# Patient Record
Sex: Male | Born: 1955 | Race: White | Hispanic: No | Marital: Single | Smoking: Current every day smoker
Health system: Southern US, Community
[De-identification: ages and names within clinical notes are randomized; demographics above are authoritative.]

## PROBLEM LIST (undated history)

## (undated) DIAGNOSIS — B1920 Unspecified viral hepatitis C without hepatic coma: Secondary | ICD-10-CM

## (undated) DIAGNOSIS — I509 Heart failure, unspecified: Secondary | ICD-10-CM

## (undated) DIAGNOSIS — M419 Scoliosis, unspecified: Secondary | ICD-10-CM

## (undated) DIAGNOSIS — J449 Chronic obstructive pulmonary disease, unspecified: Secondary | ICD-10-CM

## (undated) HISTORY — DX: Unspecified viral hepatitis C without hepatic coma: B19.20

## (undated) HISTORY — PX: TONSILLECTOMY: SUR1361

---

## 2017-06-15 ENCOUNTER — Encounter (HOSPITAL_COMMUNITY): Payer: Self-pay | Admitting: Emergency Medicine

## 2017-06-15 ENCOUNTER — Other Ambulatory Visit: Payer: Self-pay

## 2017-06-15 ENCOUNTER — Emergency Department (HOSPITAL_COMMUNITY)
Admission: EM | Admit: 2017-06-15 | Discharge: 2017-06-15 | Disposition: A | Discharge status: Home / Self Care | Payer: Medicaid Other | Attending: Emergency Medicine | Admitting: Emergency Medicine

## 2017-06-15 ENCOUNTER — Emergency Department (HOSPITAL_COMMUNITY): Payer: Medicaid Other

## 2017-06-15 DIAGNOSIS — I509 Heart failure, unspecified: Secondary | ICD-10-CM | POA: Diagnosis not present

## 2017-06-15 DIAGNOSIS — J40 Bronchitis, not specified as acute or chronic: Secondary | ICD-10-CM | POA: Insufficient documentation

## 2017-06-15 DIAGNOSIS — R05 Cough: Secondary | ICD-10-CM | POA: Diagnosis present

## 2017-06-15 DIAGNOSIS — F17210 Nicotine dependence, cigarettes, uncomplicated: Secondary | ICD-10-CM | POA: Diagnosis not present

## 2017-06-15 DIAGNOSIS — J449 Chronic obstructive pulmonary disease, unspecified: Secondary | ICD-10-CM | POA: Insufficient documentation

## 2017-06-15 HISTORY — DX: Chronic obstructive pulmonary disease, unspecified: J44.9

## 2017-06-15 HISTORY — DX: Heart failure, unspecified: I50.9

## 2017-06-15 HISTORY — DX: Scoliosis, unspecified: M41.9

## 2017-06-15 MED ORDER — BENZONATATE 100 MG PO CAPS: 100.0000 mg | ORAL_CAPSULE | Freq: Three times a day (TID) | ORAL | 0 refills | Status: DC

## 2017-06-15 MED ORDER — DOXYCYCLINE HYCLATE 100 MG PO CAPS: 100.0000 mg | ORAL_CAPSULE | Freq: Two times a day (BID) | ORAL | 0 refills | Status: DC

## 2017-06-15 MED ORDER — GUAIFENESIN-CODEINE 100-10 MG/5ML PO SOLN: 5.0000 mL | Freq: Every evening | ORAL | 0 refills | Status: DC | PRN

## 2017-06-15 NOTE — ED Provider Notes (Signed)
[facility] EMERGENCY DEPARTMENT Provider Note   CSN: [id number] Arrival date & time: 06/15/17  1548     History   Chief Complaint Chief Complaint  Patient presents with  . Cough    HPI Blanchard KelchCarl F Konopka is a 62 y.o. male who presents to the ED with a URI symptoms that started a week ago. Patient reports cough, congestion and nasal drainage. Patient reports that when he was a child that he was hospitalized for pneumonia and now has been dx with COPD and has had CHF in the past. Patient smokes every day at least a half pack.   The history is provided by the patient. No language interpreter was used.  Cough  This is a new problem. The current episode started more than 2 days ago. The problem has been gradually worsening. The cough is productive of sputum. There has been no fever. Associated symptoms include chills, sweats and myalgias. Pertinent negatives include no chest pain, no ear pain, no headaches and no eye redness. Sore throat: scratchy. He is a smoker. His past medical history is significant for pneumonia, COPD and asthma.    Past Medical History:  Diagnosis Date  . CHF (congestive heart failure) (HCC)   . COPD (chronic obstructive pulmonary disease) (HCC)   . Scoliosis     There are no active problems to display for this patient.   Past Surgical History:  Procedure Laterality Date  . TONSILLECTOMY         Home Medications    Prior to Admission medications   Medication Sig Start Date End Date Taking? Authorizing Provider  benzonatate (TESSALON) 100 MG capsule Take 1 capsule (100 mg total) by mouth every 8 (eight) hours. 06/15/17   Janne NapoleonNeese, Lamekia Nolden M, NP  doxycycline (VIBRAMYCIN) 100 MG capsule Take 1 capsule (100 mg total) by mouth 2 (two) times daily. 06/15/17   Janne NapoleonNeese, Cai Flott M, NP  guaiFENesin-codeine 100-10 MG/5ML syrup Take 5 mLs by mouth at bedtime as needed for cough. 06/15/17   Janne NapoleonNeese, Kabrina Christiano M, NP    Family History Family History  Problem Relation Age of Onset  .  Cancer Mother   . Alcoholism Father   . Heart disease Father     Social History Social History   Tobacco Use  . Smoking status: Current Every Day Smoker    Packs/day: 0.50    Types: Cigarettes  . Smokeless tobacco: Never Used  Substance Use Topics  . Alcohol use: Not on file  . Drug use: No     Allergies   Biaxin [clarithromycin] and Penicillins   Review of Systems Review of Systems  Constitutional: Positive for chills.  HENT: Positive for congestion. Negative for ear pain and trouble swallowing. Sore throat: scratchy.   Eyes: Negative for pain, discharge, redness and itching.  Respiratory: Positive for cough.   Cardiovascular: Negative for chest pain.  Gastrointestinal: Negative for abdominal pain, nausea and vomiting.  Genitourinary: Negative for frequency, hematuria and urgency.  Musculoskeletal: Positive for myalgias.  Skin: Negative for rash.  Neurological: Negative for weakness and headaches.  Psychiatric/Behavioral: Negative for confusion.     Physical Exam Updated Vital Signs BP 117/85   Pulse 99   Temp 98 F (36.7 C) (Oral)   Resp 16   Ht 6\' 2"  (1.88 m)   Wt 95.3 kg (210 lb)   SpO2 99%   BMI 26.96 kg/m   Physical Exam  Constitutional: He is oriented to person, place, and time. He appears well-developed and well-nourished. No  distress.  HENT:  Head: Normocephalic.  Right Ear: Tympanic membrane normal.  Left Ear: Tympanic membrane normal.  Nose: Mucosal edema and rhinorrhea present.  Mouth/Throat: Uvula is midline and mucous membranes are normal. Posterior oropharyngeal erythema present.  Eyes: Conjunctivae and EOM are normal.  Neck: Normal range of motion. Neck supple.  Cardiovascular: Normal rate and regular rhythm.  Pulmonary/Chest: Effort normal. No respiratory distress. He has wheezes (occasional). He has no rales.  Abdominal: Soft. Bowel sounds are normal. There is no tenderness.  Musculoskeletal: Normal range of motion.  Neurological: He  is alert and oriented to person, place, and time. No cranial nerve deficit.  Skin: Skin is warm and dry.  Psychiatric: He has a normal mood and affect.  Nursing note and vitals reviewed.    ED Treatments / Results  Labs (all labs ordered are listed, but only abnormal results are displayed) Labs Reviewed - No data to display  EKG Radiology Dg Chest 2 View  Result Date: 06/15/2017 CLINICAL DATA:  Cough and head congestion x1 week. EXAM: CHEST  2 VIEW COMPARISON:  None. FINDINGS: The heart size and mediastinal contours are within normal limits. Scarring at the apices right greater than left. No pneumonic consolidation or CHF. Mild hyperinflation of the lungs. The visualized skeletal structures are unremarkable. IMPRESSION: Hyperinflation of the lungs without acute pneumonic consolidation. Scarring at the apices right greater than left. Electronically Signed   By: Tollie Eth M.D.   On: 06/15/2017 16:45    Procedures Procedures (including critical care time)  Medications Ordered in ED Medications - No data to display   Initial Impression / Assessment and Plan / ED Course  I have reviewed the triage vital signs and the nursing notes. 62 y.o. male with hx of COPD, asthma and is an everyday smoker here tonight with cough and congestion. Pt CXR negative for acute infiltrate. Patients symptoms are consistent with bronchitis. Stable for d/c without respiratory distress. O2 SAT 99% on R/A. Pt will be discharged with antibiotics and cough medications. Patient verbalizes understanding and is agreeable with plan. Pt is hemodynamically stable & in NAD prior to dc. Patient to f/u with PCP. Return precautions discussed.  Pertinent labs & imaging results that were available during my care of the patient were reviewed by me and considered in my medical decision making (see chart for details).   Final Clinical Impressions(s) / ED Diagnoses   Final diagnoses:  Bronchitis    ED Discharge Orders         Ordered    benzonatate (TESSALON) 100 MG capsule  Every 8 hours     06/15/17 1732    guaiFENesin-codeine 100-10 MG/5ML syrup  At bedtime PRN     06/15/17 1732    doxycycline (VIBRAMYCIN) 100 MG capsule  2 times daily     06/15/17 1732       Kerrie Buffalo Franklin, NP 06/16/17 0044    Marily Memos, MD 06/16/17 1719

## 2017-06-15 NOTE — Discharge Instructions (Signed)
Be sure you are drinking plenty of fluids to prevent dehydration. Follow up with your primary care provider. Use your inhaler as needed for wheezing. Return here as needed for worsening symptoms.

## 2017-06-15 NOTE — ED Triage Notes (Signed)
Cough, head congestion, and nasal drainage x 1 week.

## 2017-06-15 NOTE — ED Notes (Signed)
HN in to assess 

## 2017-06-15 NOTE — ED Notes (Signed)
Pt reports no PCP in this area  Has hx of COPD and continues to smoke  Has cough of 1 week  Here for eval

## 2017-10-18 ENCOUNTER — Encounter (INDEPENDENT_AMBULATORY_CARE_PROVIDER_SITE_OTHER): Payer: Self-pay | Admitting: Internal Medicine

## 2017-10-18 ENCOUNTER — Ambulatory Visit (INDEPENDENT_AMBULATORY_CARE_PROVIDER_SITE_OTHER): Payer: Medicaid Other | Admitting: Internal Medicine

## 2017-10-18 VITALS — BP 144/90 | HR 60 | Temp 97.8°F | Ht 74.0 in | Wt 197.6 lb

## 2017-10-18 DIAGNOSIS — B1920 Unspecified viral hepatitis C without hepatic coma: Secondary | ICD-10-CM | POA: Diagnosis not present

## 2017-10-18 DIAGNOSIS — B1710 Acute hepatitis C without hepatic coma: Secondary | ICD-10-CM | POA: Diagnosis not present

## 2017-10-18 NOTE — Progress Notes (Signed)
   Subjective:    Patient ID: Wesley Hughes, male    DOB: [birth year], 62 y.o.   MRN: [id number]  HPI Referred by Dr. Otilio MiuBarrino for Hepatitis C. Diagnosed in 2013. 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 In the 70s he gives a hx of jaundice.  He says he was treated in [place] in the 70s. Has not done drugs in years. Does not smoke marijuana. Does not drink.  Has one non-professional tattoo age 62.  Continues to smoke cigarettes.    12/26/2017 He B S Ab non reactive.  HBsAB less than 8. Hepatitis B Surface antigen negative,  Hepatitis C ab positive. HIV: non reactive   Hx of CHF, COPD, Chronic back pain.  Review of Systems   Past Medical History:  Diagnosis Date  . CHF (congestive heart failure) (HCC)   . COPD (chronic obstructive pulmonary disease) (HCC)   . Hepatitis C   . Scoliosis     Past Surgical History:  Procedure Laterality Date  . TONSILLECTOMY      Allergies  Allergen Reactions  . Biaxin [Clarithromycin] Rash    Red man syndrome  . Penicillins Rash    Patient states he is very sensitive to anitibiotics    Current Outpatient Medications on File Prior to Visit  Medication Sig Dispense Refill  . albuterol (PROVENTIL HFA;VENTOLIN HFA) 108 (90 Base) MCG/ACT inhaler Inhale into the lungs every 6 (six) hours as needed for wheezing or shortness of breath.    Marland Kitchen. atorvastatin (LIPITOR) 10 MG tablet Take by mouth daily.    . Fluticasone-Salmeterol (ADVAIR) 100-50 MCG/DOSE AEPB Inhale 1 puff into the lungs 2 (two) times daily.    . nitroGLYCERIN (NITROSTAT) 0.4 MG SL tablet Place 0.4 mg under the tongue every 5 (five) minutes as needed for chest pain.    Marland Kitchen. tiotropium (SPIRIVA) 18 MCG inhalation capsule Place 18 mcg into inhaler and inhale daily.     No current facility-administered medications on file prior to visit.         Objective:   Physical Exam Blood pressure (!) 144/90, pulse 60, temperature 97.8 F (36.6 C), height 6\' 2"  (1.88 m), weight 197 lb 9.6 oz (89.6 kg). Alert and  oriented. Skin warm and dry. Oral mucosa is moist.   . Sclera anicteric, conjunctivae is pink. Thyroid not enlarged. No cervical lymphadenopathy. Lungs clear. Heart regular rate and rhythm.  Abdomen is soft. Bowel sounds are positive. No hepatomegaly. No abdominal masses felt. No tenderness.  No edema to lower extremities.           Assessment & Plan:  Hepatitis C. Labs and US abdomen/elast.  HIV, Hepatic, CBC, Hep C quaint, Hep C genotype, AFP, US elast. Urine drug screen.  Further recommendations to follow.  OV pending.

## 2017-10-18 NOTE — Patient Instructions (Addendum)
Labs and US RUQ elast.

## 2017-10-23 NOTE — Addendum Note (Signed)
Addended by: Len BlalockSETZER, TERRI L on: 10/23/2017 08:40 AM   Modules accepted: Orders

## 2017-10-24 ENCOUNTER — Telehealth (INDEPENDENT_AMBULATORY_CARE_PROVIDER_SITE_OTHER): Payer: Self-pay | Admitting: Internal Medicine

## 2017-10-24 ENCOUNTER — Encounter (INDEPENDENT_AMBULATORY_CARE_PROVIDER_SITE_OTHER): Payer: Self-pay | Admitting: *Deleted

## 2017-10-24 NOTE — Telephone Encounter (Signed)
Patient has called twice stating he had messages left yesterday when he didn't have his phone with him

## 2017-10-24 NOTE — Telephone Encounter (Signed)
This is for [employer]

## 2017-10-26 NOTE — Telephone Encounter (Signed)
Called patient voice mail is full cannot leave message. I tried several times this week to call him in regards to US appt but couldn't get through. I mailed him a letter with US appt info

## 2017-10-30 ENCOUNTER — Other Ambulatory Visit (HOSPITAL_COMMUNITY): Payer: Medicaid Other

## 2017-11-13 ENCOUNTER — Ambulatory Visit (HOSPITAL_COMMUNITY)
Admission: RE | Admit: 2017-11-13 | Discharge: 2017-11-13 | Disposition: A | Discharge status: Home / Self Care | Payer: Medicaid Other | Source: Ambulatory Visit | Attending: Internal Medicine | Admitting: Internal Medicine

## 2017-11-13 DIAGNOSIS — B1920 Unspecified viral hepatitis C without hepatic coma: Secondary | ICD-10-CM | POA: Insufficient documentation

## 2017-11-13 DIAGNOSIS — K8020 Calculus of gallbladder without cholecystitis without obstruction: Secondary | ICD-10-CM | POA: Diagnosis not present

## 2017-11-21 ENCOUNTER — Telehealth (INDEPENDENT_AMBULATORY_CARE_PROVIDER_SITE_OTHER): Payer: Self-pay | Admitting: Internal Medicine

## 2017-11-21 DIAGNOSIS — B182 Chronic viral hepatitis C: Secondary | ICD-10-CM

## 2017-11-21 LAB — CBC WITH DIFFERENTIAL/PLATELET
BASOS PCT: 0.3 %
Basophils Absolute: 18 cells/uL (ref 0–200)
EOS PCT: 2 %
Eosinophils Absolute: 122 cells/uL (ref 15–500)
HCT: 41 % (ref 38.5–50.0)
Hemoglobin: 14.1 g/dL (ref 13.2–17.1)
Lymphs Abs: 720 cells/uL — ABNORMAL LOW (ref 850–3900)
MCH: 31 pg (ref 27.0–33.0)
MCHC: 34.4 g/dL (ref 32.0–36.0)
MCV: 90.1 fL (ref 80.0–100.0)
MPV: 13.5 fL — AB (ref 7.5–12.5)
Monocytes Relative: 6.7 %
NEUTROS PCT: 79.2 %
Neutro Abs: 4831 cells/uL (ref 1500–7800)
Platelets: 81 10*3/uL — ABNORMAL LOW (ref 140–400)
RBC: 4.55 10*6/uL (ref 4.20–5.80)
RDW: 12.4 % (ref 11.0–15.0)
TOTAL LYMPHOCYTE: 11.8 %
WBC mixed population: 409 cells/uL (ref 200–950)
WBC: 6.1 10*3/uL (ref 3.8–10.8)

## 2017-11-21 LAB — HEPATIC FUNCTION PANEL
AG Ratio: 1.5 (calc) (ref 1.0–2.5)
ALKALINE PHOSPHATASE (APISO): 74 U/L (ref 40–115)
ALT: 22 U/L (ref 9–46)
AST: 27 U/L (ref 10–35)
Albumin: 4.2 g/dL (ref 3.6–5.1)
Bilirubin, Direct: 0.2 mg/dL (ref 0.0–0.2)
Globulin: 2.8 g/dL (calc) (ref 1.9–3.7)
Indirect Bilirubin: 0.3 mg/dL (calc) (ref 0.2–1.2)
TOTAL PROTEIN: 7 g/dL (ref 6.1–8.1)
Total Bilirubin: 0.5 mg/dL (ref 0.2–1.2)

## 2017-11-21 LAB — DRUGS OF ABUSE SCREEN W/O ALC, ROUTINE URINE
AMPHETAMINES (1000 ng/mL SCRN): NEGATIVE
BARBITURATES: NEGATIVE
BENZODIAZEPINES: POSITIVE — AB
COCAINE METABOLITES: NEGATIVE
MARIJUANA MET (50 NG/ML SCRN): POSITIVE — AB
METHADONE: NEGATIVE
METHAQUALONE: NEGATIVE
OPIATES: NEGATIVE
PHENCYCLIDINE: NEGATIVE
PROPOXYPHENE: NEGATIVE

## 2017-11-21 LAB — HEPATITIS C RNA QUANTITATIVE
HCV Quantitative Log: 6.5 Log IU/mL — ABNORMAL HIGH
HCV RNA, PCR, QN: 3180000 IU/mL — ABNORMAL HIGH

## 2017-11-21 LAB — HEPATITIS C ANTIBODY
HEP C AB: REACTIVE — AB
SIGNAL TO CUT-OFF: 25.7 — ABNORMAL HIGH (ref <1.00)

## 2017-11-21 LAB — HEPATITIS C GENOTYPE

## 2017-11-21 LAB — AFP TUMOR MARKER: AFP TUMOR MARKER: 2.8 ng/mL (ref <6.1)

## 2017-11-21 LAB — HIV ANTIBODY (ROUTINE TESTING W REFLEX): HIV: NONREACTIVE

## 2017-11-21 NOTE — Telephone Encounter (Signed)
Ann, US abdomen/elast 

## 2017-11-21 NOTE — Telephone Encounter (Signed)
US abdomen/elast ordered 

## 2017-11-22 NOTE — Telephone Encounter (Signed)
US sch'd 11/29/17 at 1030 (1015), npo after midnight, patient aware

## 2017-11-27 ENCOUNTER — Ambulatory Visit (HOSPITAL_COMMUNITY): Payer: Medicaid Other

## 2017-11-29 ENCOUNTER — Ambulatory Visit (HOSPITAL_COMMUNITY): Admission: RE | Admit: 2017-11-29 | Payer: Medicaid Other | Source: Ambulatory Visit

## 2017-12-23 DEATH — deceased

## 2018-03-14 ENCOUNTER — Telehealth (INDEPENDENT_AMBULATORY_CARE_PROVIDER_SITE_OTHER): Payer: Self-pay | Admitting: Internal Medicine

## 2018-03-14 NOTE — Telephone Encounter (Signed)
I have tried calling patient. He will be treated with Mavyret x 8 weeks. I apparently overlooked his drug screen, this is the cause of the delay

## 2018-03-14 NOTE — Telephone Encounter (Signed)
[facility] has been [facility] Once we have heard from them we will let the patient know.

## 2018-03-14 NOTE — Telephone Encounter (Signed)
Wesley Hughes, Hep C patient. 1a.  Mavyret x 8 weeks.

## 2019-03-24 IMAGING — DX DG CHEST 2V
3 series · 3 of 3 positions shown · non-contrast
Comparison: None.

CLINICAL DATA: Cough and head congestion x1 week.

EXAM:
CHEST  2 VIEW

[chest pa]
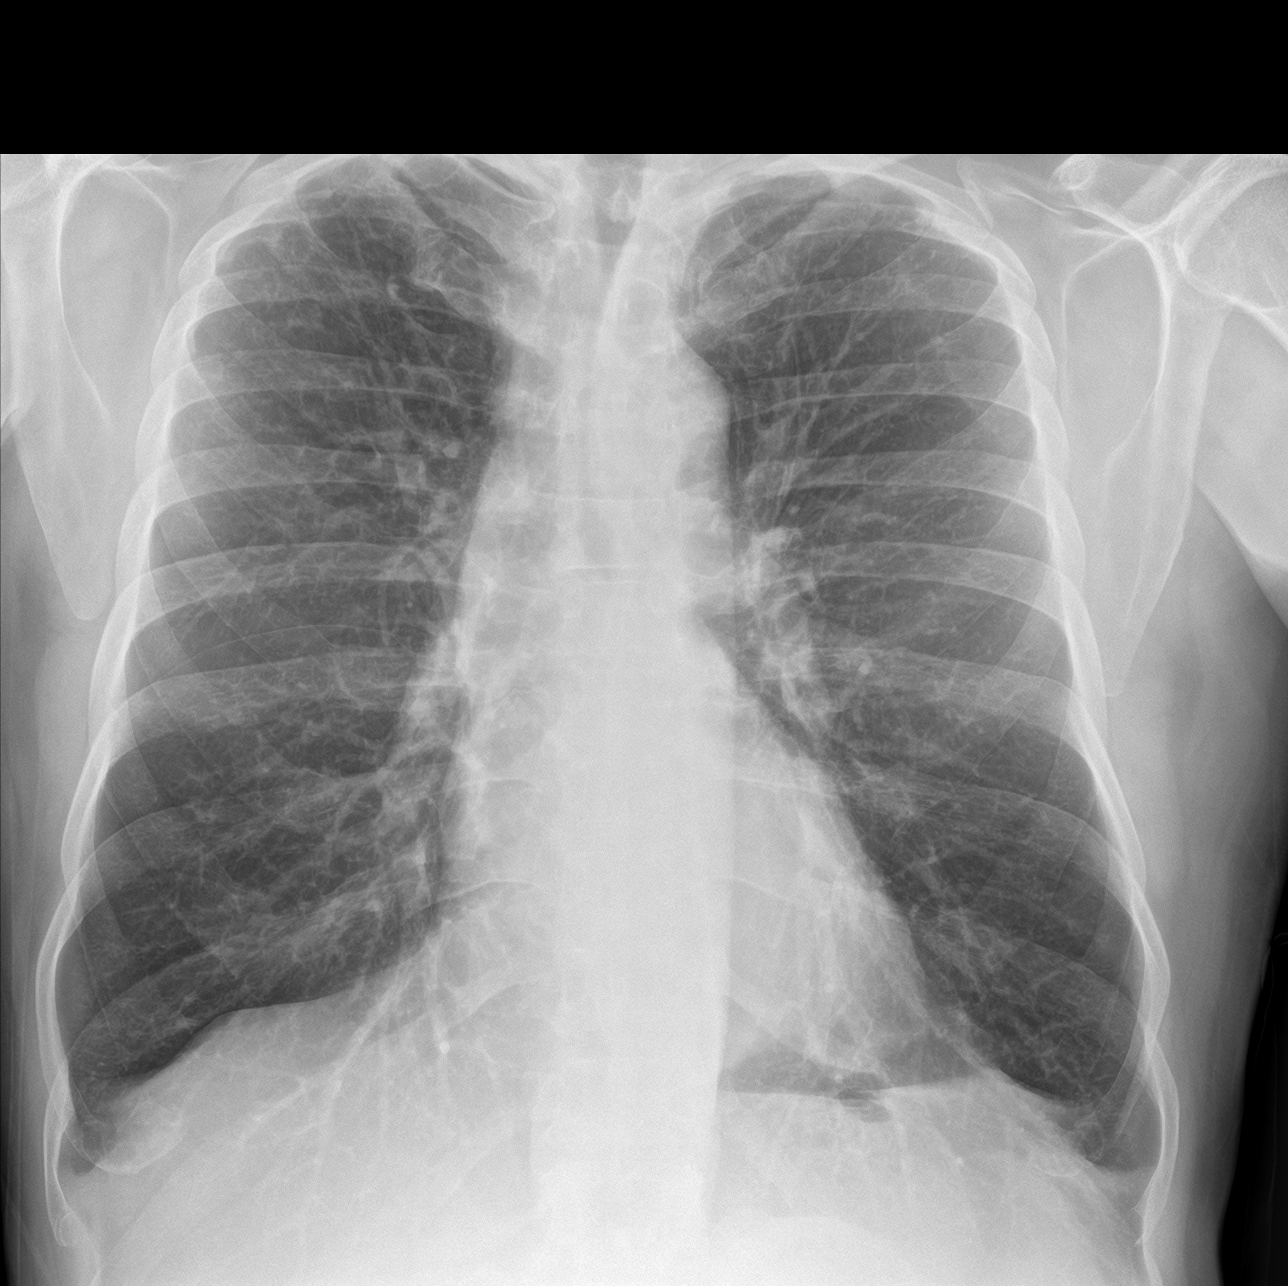

[chest lat (1 of 2)]
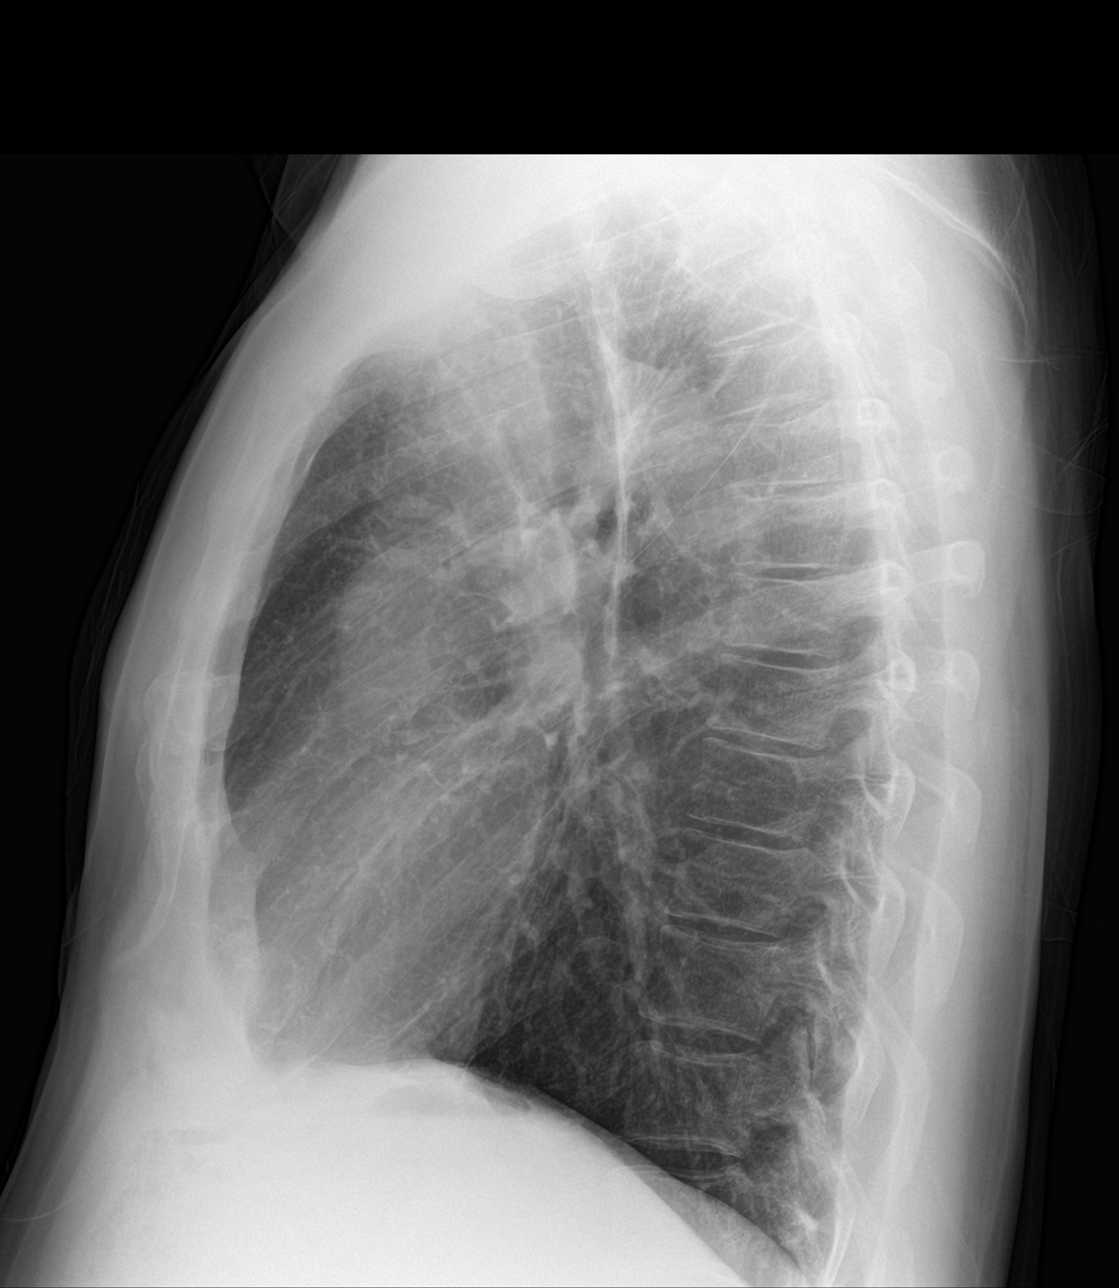

[chest lat (2 of 2)]
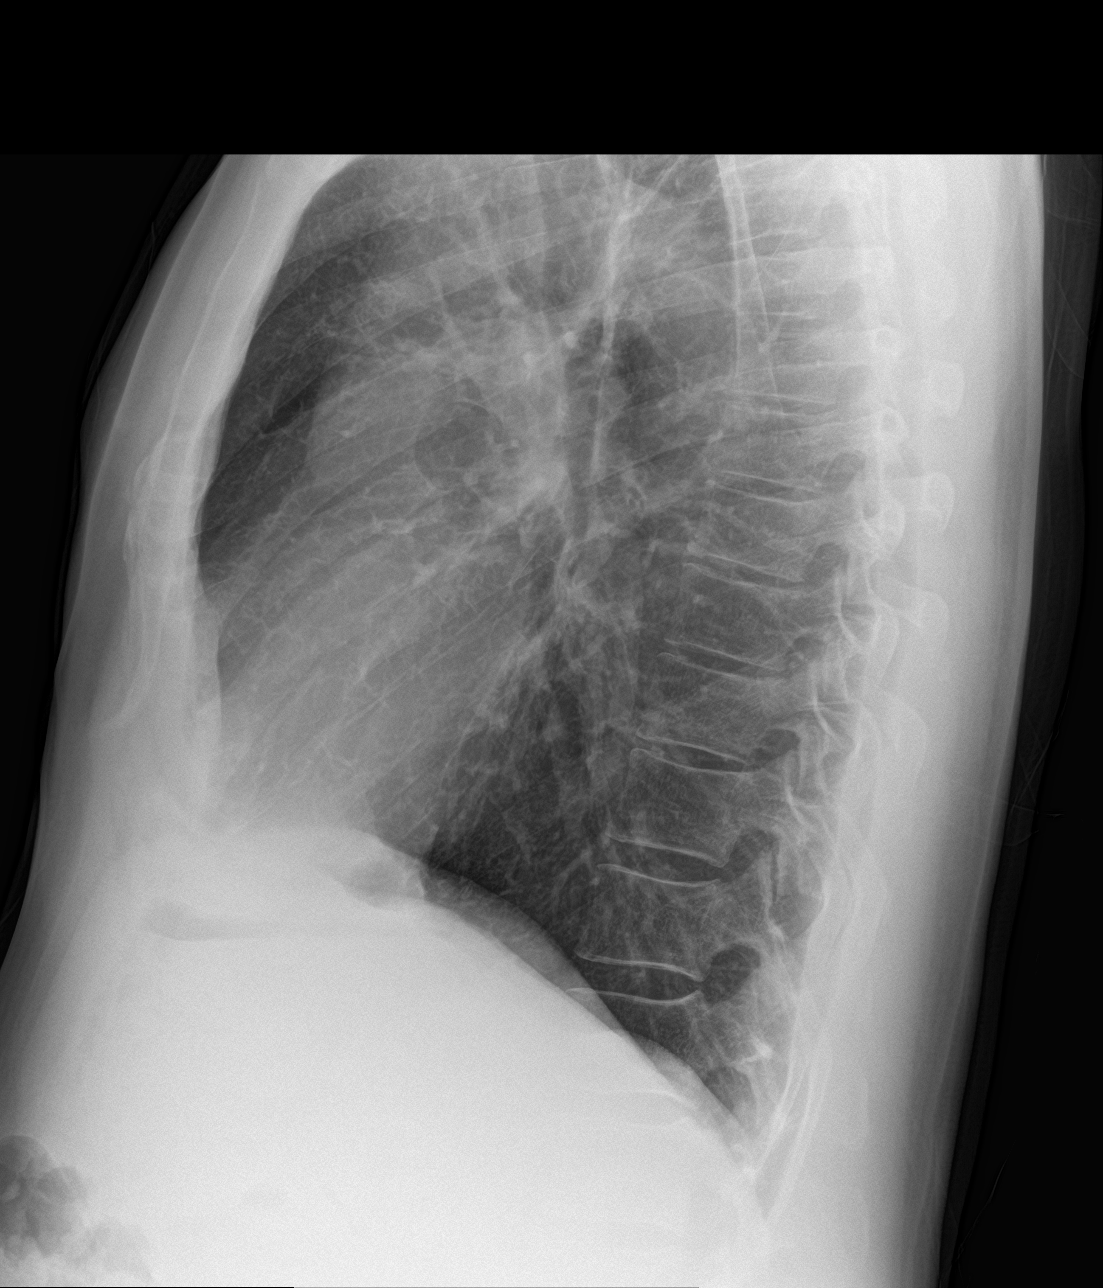

[3 of 3 positions shown; findings below may reference images not displayed]

FINDINGS: The heart size and mediastinal contours are within normal limits.
Scarring at the apices right greater than left. No pneumonic
consolidation or CHF. Mild hyperinflation of the lungs. The
visualized skeletal structures are unremarkable.
IMPRESSION: Hyperinflation of the lungs without acute pneumonic consolidation.
Scarring at the apices right greater than left.
